# Patient Record
Sex: Male | Born: 1945 | Race: White | Hispanic: No | Marital: Married | Smoking: Never smoker
Health system: Southern US, Community
[De-identification: ages and names within clinical notes are randomized; demographics above are authoritative.]

## PROBLEM LIST (undated history)

## (undated) DIAGNOSIS — I10 Essential (primary) hypertension: Secondary | ICD-10-CM

## (undated) DIAGNOSIS — E785 Hyperlipidemia, unspecified: Secondary | ICD-10-CM

## (undated) DIAGNOSIS — E039 Hypothyroidism, unspecified: Secondary | ICD-10-CM

## (undated) HISTORY — DX: Essential (primary) hypertension: I10

## (undated) HISTORY — DX: Hypothyroidism, unspecified: E03.9

## (undated) HISTORY — DX: Hyperlipidemia, unspecified: E78.5

---
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
## 1984-12-09 HISTORY — PX: COLON RESECTION: SHX5231

## 1985-12-09 HISTORY — PX: OTHER SURGICAL HISTORY: SHX169

## 1994-12-09 HISTORY — PX: SEPTOPLASTY: SUR1290

## 1994-12-09 HISTORY — PX: OTHER SURGICAL HISTORY: SHX169

## 1994-12-09 HISTORY — PX: TONSILLECTOMY: SUR1361

## 2000-03-28 ENCOUNTER — Ambulatory Visit (HOSPITAL_COMMUNITY): Admission: RE | Admit: 2000-03-28 | Discharge: 2000-03-28 | Payer: Self-pay | Admitting: General Surgery

## 2000-03-28 ENCOUNTER — Encounter (INDEPENDENT_AMBULATORY_CARE_PROVIDER_SITE_OTHER): Payer: Self-pay | Admitting: Specialist

## 2000-03-28 ENCOUNTER — Encounter: Payer: Self-pay | Admitting: General Surgery

## 2002-12-09 HISTORY — PX: LAPAROSCOPIC CHOLECYSTECTOMY: SUR755

## 2003-05-04 ENCOUNTER — Ambulatory Visit (HOSPITAL_COMMUNITY): Admission: RE | Admit: 2003-05-04 | Discharge: 2003-05-04 | Payer: Self-pay | Admitting: Family Medicine

## 2004-03-06 ENCOUNTER — Emergency Department (HOSPITAL_COMMUNITY): Admission: EM | Admit: 2004-03-06 | Discharge: 2004-03-06 | Payer: Self-pay

## 2004-03-30 ENCOUNTER — Ambulatory Visit (HOSPITAL_COMMUNITY): Admission: RE | Admit: 2004-03-30 | Discharge: 2004-03-30 | Payer: Self-pay | Admitting: General Surgery

## 2004-04-19 ENCOUNTER — Observation Stay (HOSPITAL_COMMUNITY): Admission: RE | Admit: 2004-04-19 | Discharge: 2004-04-20 | Payer: Self-pay | Admitting: General Surgery

## 2004-04-19 ENCOUNTER — Encounter (INDEPENDENT_AMBULATORY_CARE_PROVIDER_SITE_OTHER): Payer: Self-pay | Admitting: *Deleted

## 2004-05-28 ENCOUNTER — Encounter: Admission: RE | Admit: 2004-05-28 | Discharge: 2004-05-28 | Payer: Self-pay | Admitting: Orthopedic Surgery

## 2004-08-06 IMAGING — CT CT ABDOMEN W/ CM
1 of 4 series · 14 of 32 positions shown, 19 images · IV contrast (omnipaque)
Comparison: No prior studies available for comparison.

CLINICAL DATA: Abdominal pain.
CT ABDOMEN WITH CONTRAST 03/06/04
TECHNIQUE: Contiguous axial CT images were obtained from the lung bases to the iliac crests following oral contrast and intravenous administration of 150 cc of Omnipaque 300 IV contrast.
TECHNIQUE: Contiguous axial CT images were obtained from the iliac crests to the proximal femurs.

[Series 2: abd/pelvis 5.0 b30f · axial · 0.82mm/px · z∈[+1323,+1773]mm · 14 of 104 slices shown, 19 images]
[im 7/104  soft-tissue]
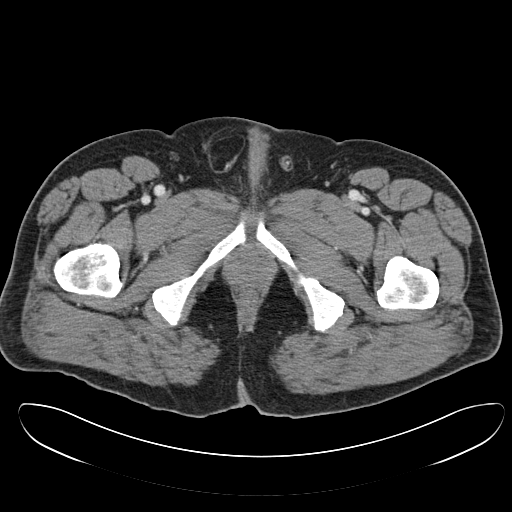
[im 7/104  bone]
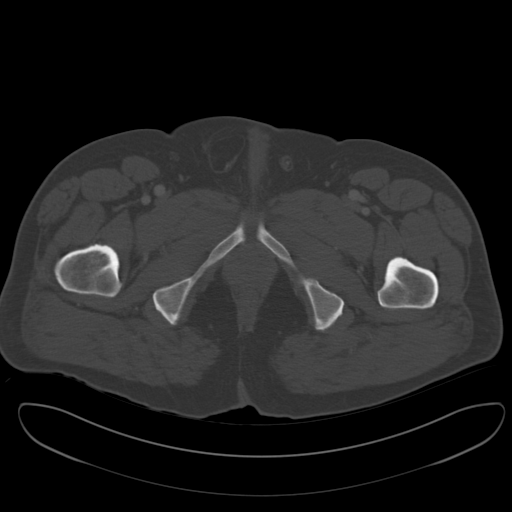
[im 13/104  soft-tissue]
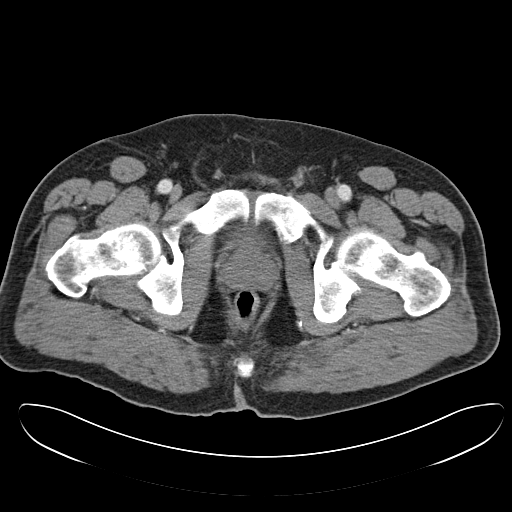
[im 25/104  soft-tissue]
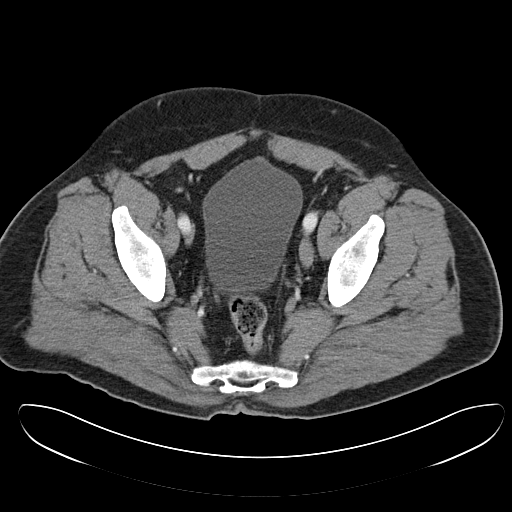
[im 31/104  soft-tissue]
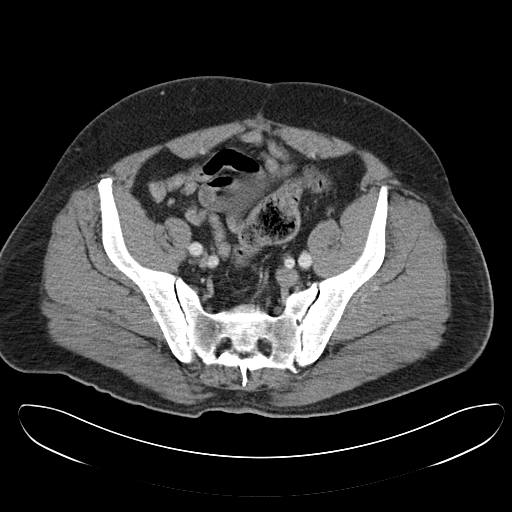
[im 37/104  soft-tissue]
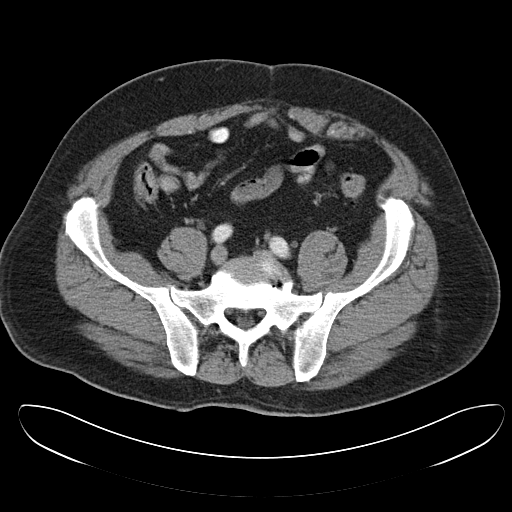
[im 43/104  soft-tissue]
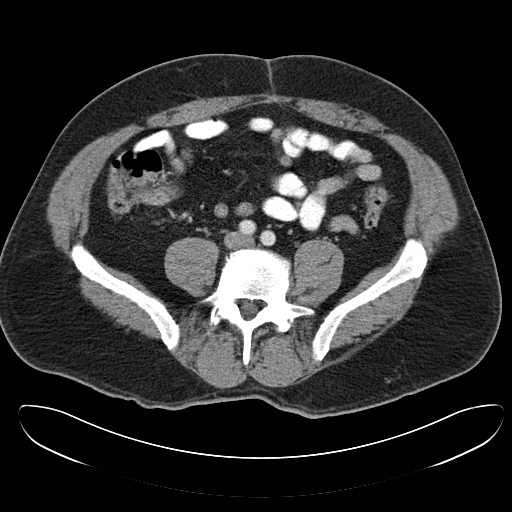
[im 55/104  soft-tissue]
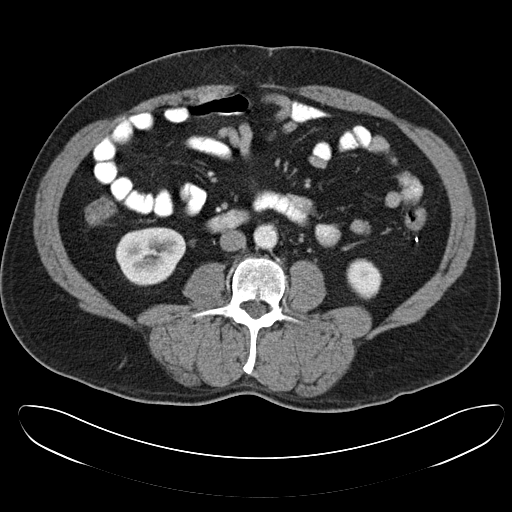
[im 61/104  soft-tissue]
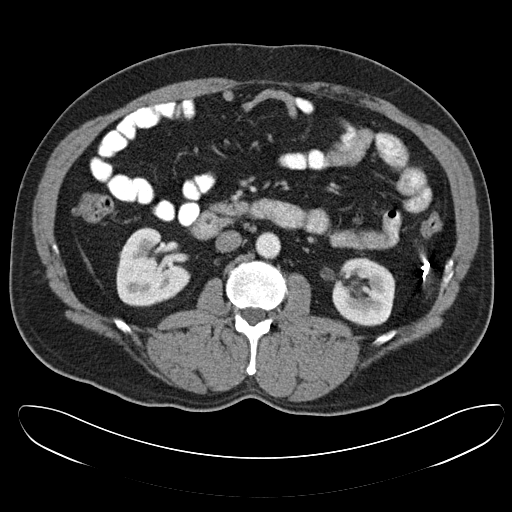
[im 67/104  soft-tissue]
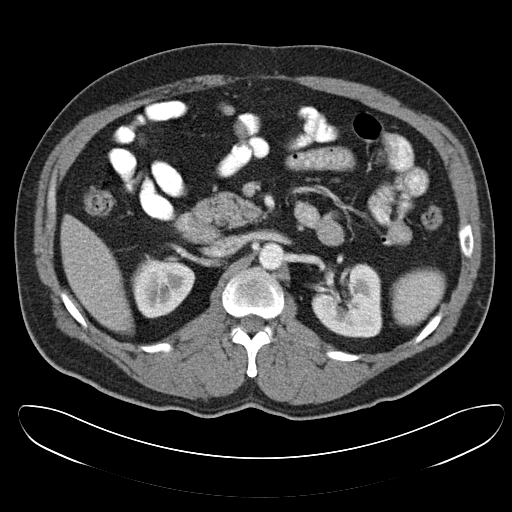
[im 67/104  bone]
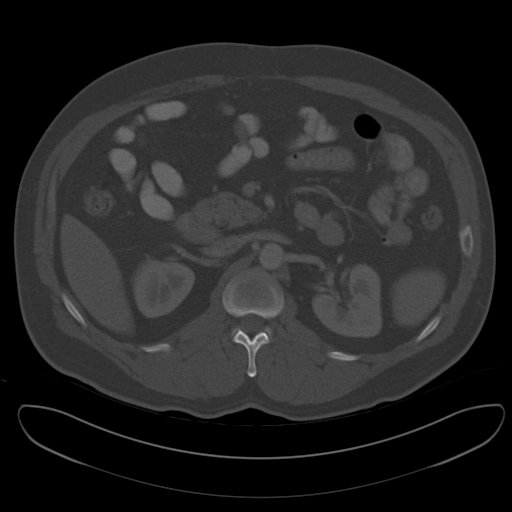
[im 73/104  soft-tissue]
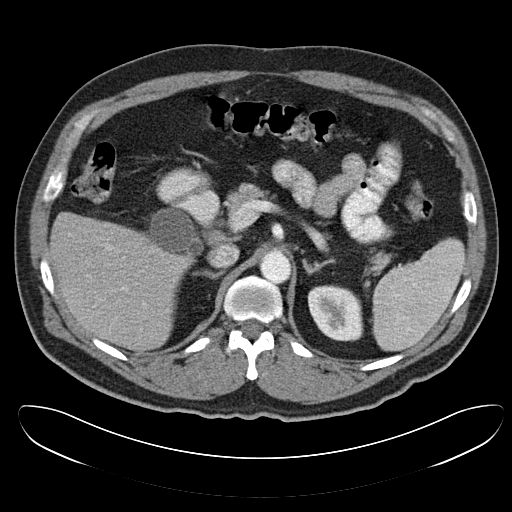
[im 79/104  soft-tissue]
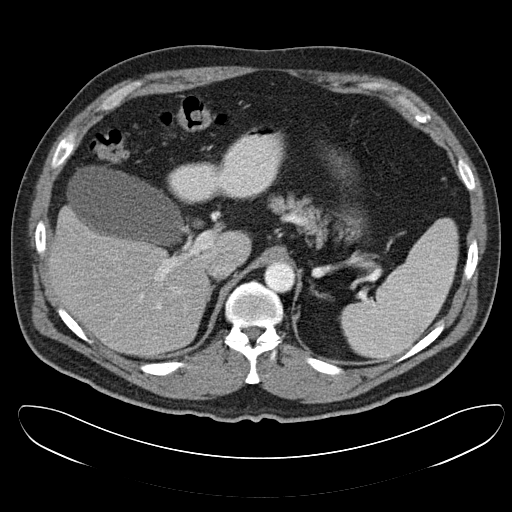
[im 79/104  lung]
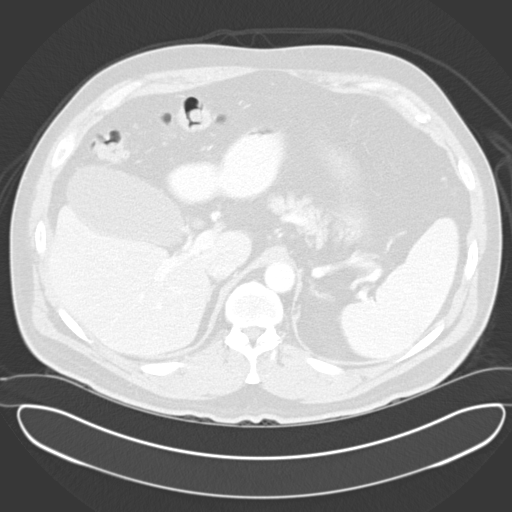
[im 85/104  lung]
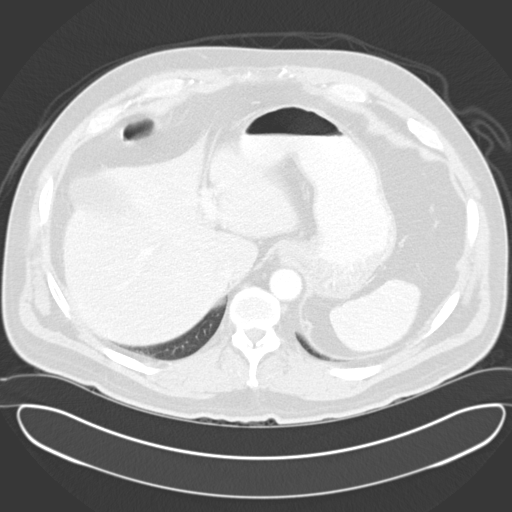
[im 91/104  soft-tissue]
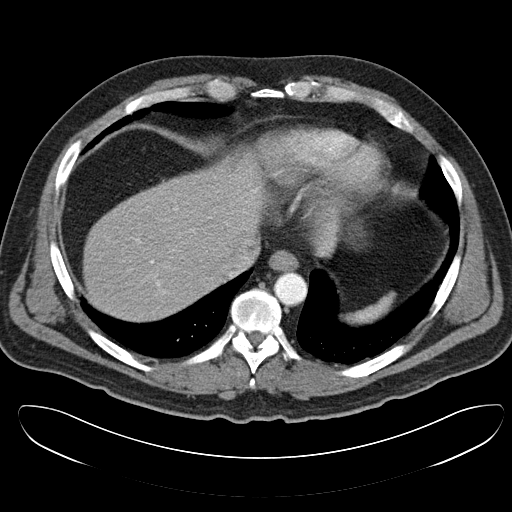
[im 91/104  lung]
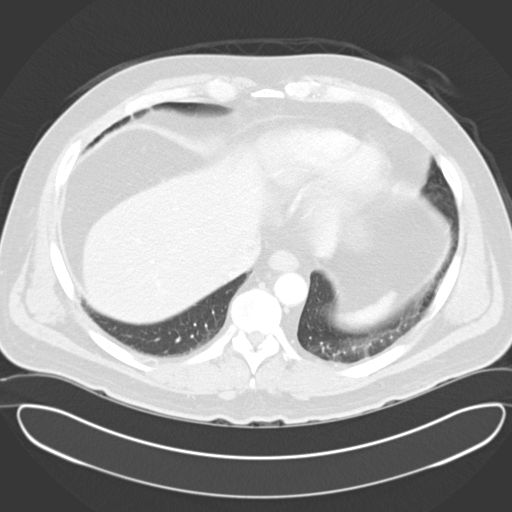
[im 97/104  soft-tissue]
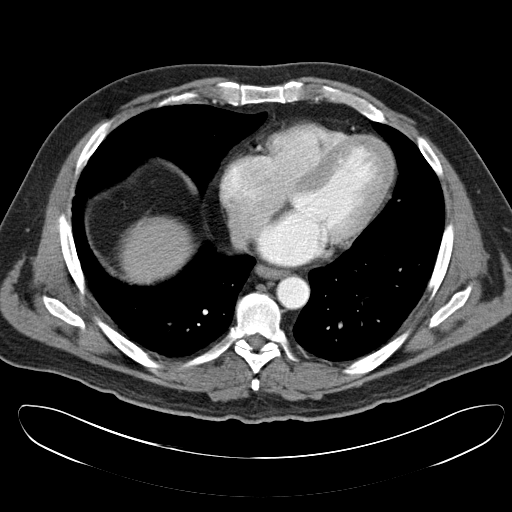
[im 97/104  lung]
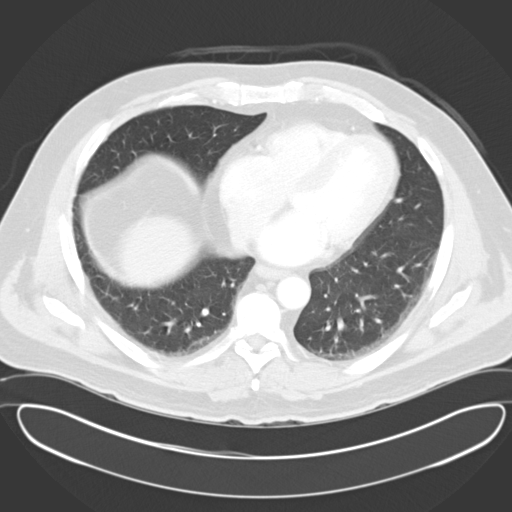

[14 of 32 positions shown; findings below may reference images not displayed]

FINDINGS: There are multiple calcified gallstones in the gallbladder.  The gallbladder is mildly distended measuring up to 12 cm in length.  The common bile duct measures 8 mm in diameter which is abnormal.  No pancreatic duct dilatation is evident.  The spleen appears unremarkable.  No intrahepatic bile duct dilatation is noted.  Adrenal glands appear normal.  
There is a 6 mm hypodense lesion in the right mid kidney most consistent with a small cyst.  The left kidney appears unremarkable. 
Scattered small retroperitoneal lymph nodes are present, but are not pathologically enlarged by CT size criteria.  
IMPRESSION
1.  Gallstones and mildly dilated gallbladder.  The common bile duct appears minimally dilated.  
2.  Hypodense lesion in the right kidney is likely to represent a cyst. 
CT OF THE PELVIS WITH CONTRAST
FINDINGS: There is no visualization of the appendix.  No free pelvic fluid.  There are some colonic diverticula, but no evidence of active diverticulitis.  Distal ureters appear unremarkable.  A right inguinal hernia contains only adipose tissue. 
IMPRESSION
1.  Right inguinal hernia containing adipose tissue.
2.  Mild descending colon diverticulosis without active diverticulitis.

## 2004-10-22 ENCOUNTER — Ambulatory Visit: Payer: Self-pay | Admitting: Gastroenterology

## 2006-05-16 ENCOUNTER — Ambulatory Visit: Payer: Self-pay | Admitting: Gastroenterology

## 2006-05-29 ENCOUNTER — Ambulatory Visit: Payer: Self-pay | Admitting: Gastroenterology

## 2008-04-29 ENCOUNTER — Ambulatory Visit (HOSPITAL_COMMUNITY): Admission: RE | Admit: 2008-04-29 | Discharge: 2008-04-29 | Payer: Self-pay | Admitting: *Deleted

## 2008-05-07 ENCOUNTER — Emergency Department (HOSPITAL_COMMUNITY): Admission: EM | Admit: 2008-05-07 | Discharge: 2008-05-07 | Payer: Self-pay | Admitting: Emergency Medicine

## 2008-09-29 IMAGING — CR DG CHEST 2V
2 series · 2 of 2 positions shown · non-contrast
Comparison: 04/18/2004

CLINICAL DATA: Preop

CHEST - 2 VIEW

[w chest pa]
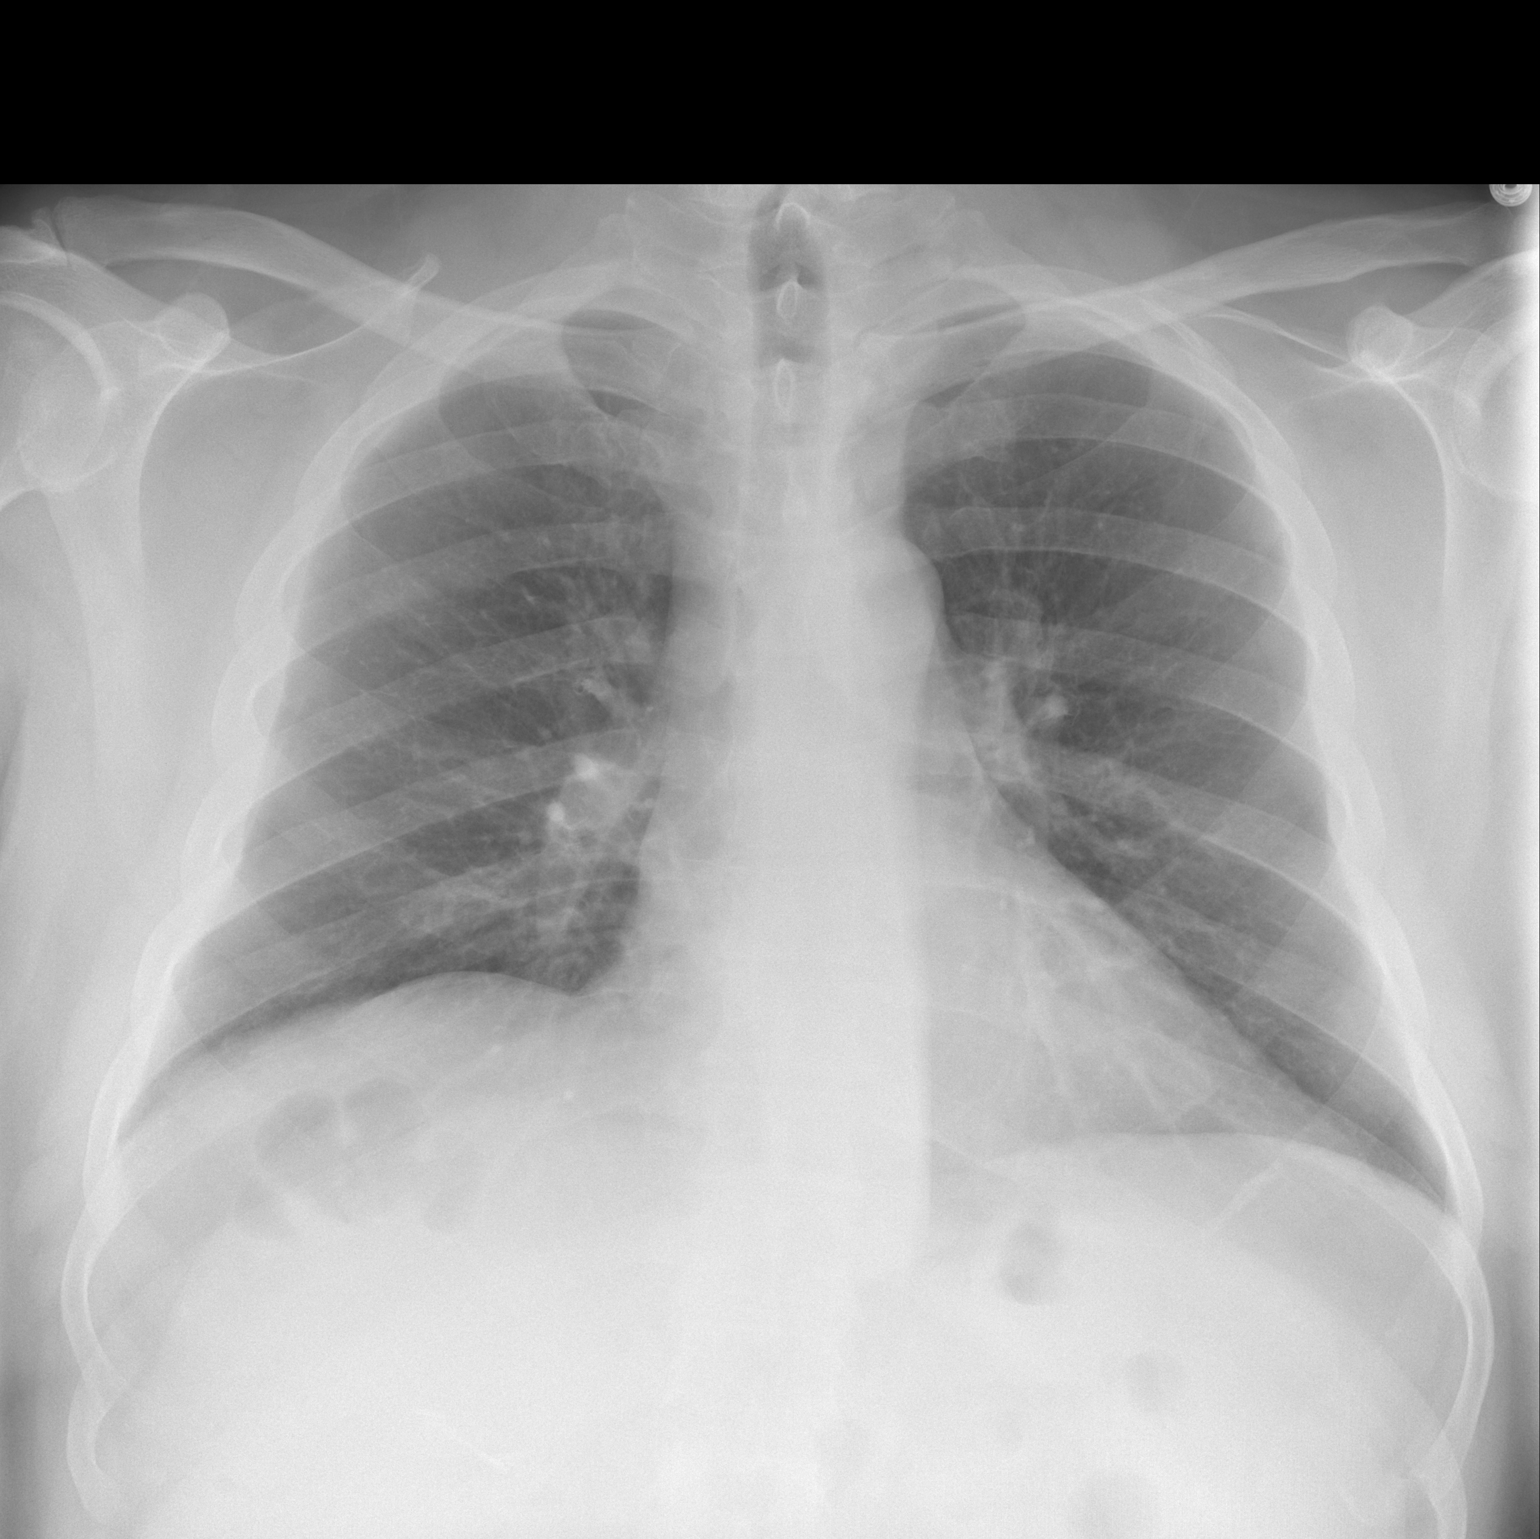

[w chest lat]
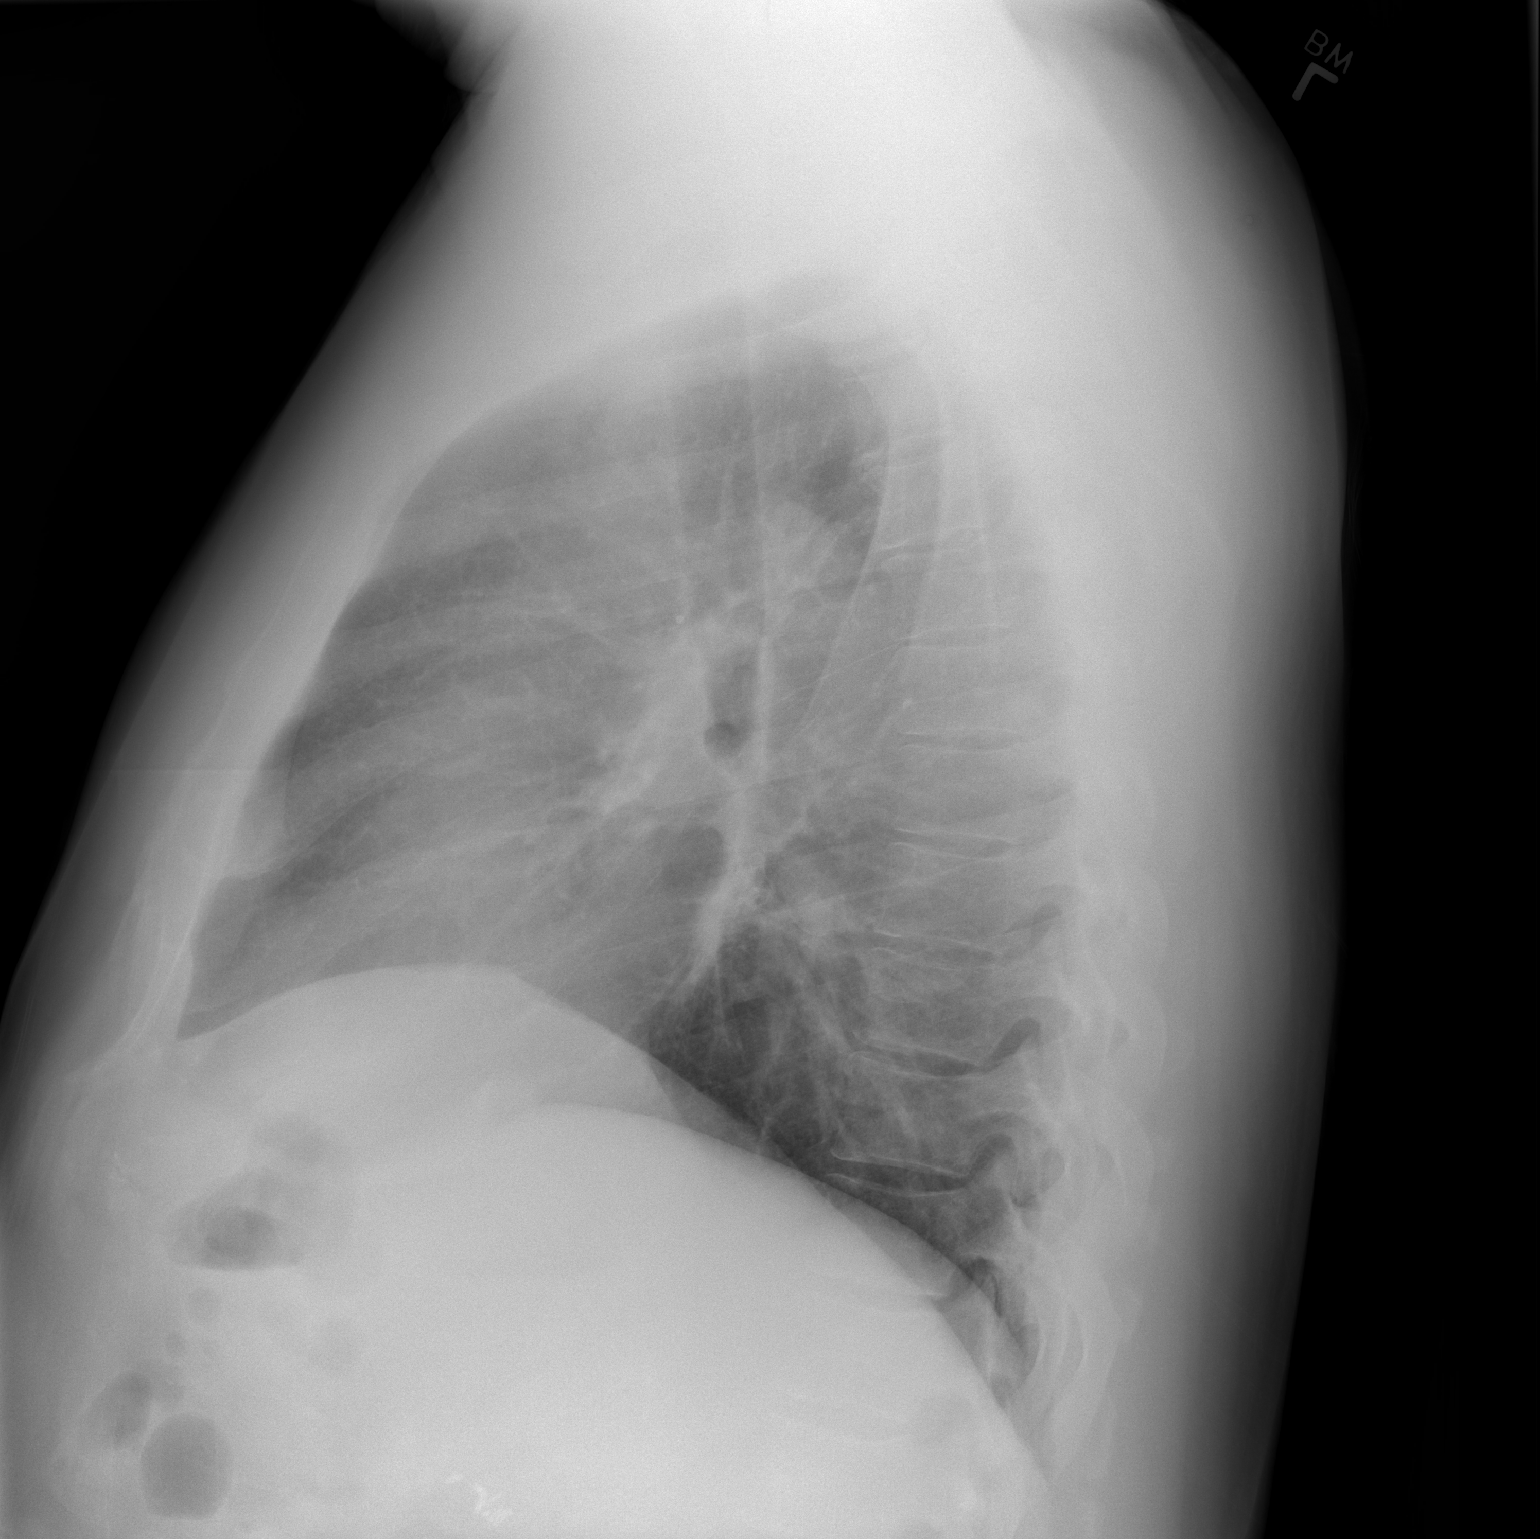

[2 of 2 positions shown; findings below may reference images not displayed]

FINDINGS: The cardiomediastinal silhouette is stable.  No acute
infiltrate or edema.  The bony thorax is unremarkable.  No pleural
effusion.
IMPRESSION: No active cardiopulmonary disease.

## 2009-07-11 ENCOUNTER — Telehealth: Payer: Self-pay | Admitting: Internal Medicine

## 2009-07-12 ENCOUNTER — Ambulatory Visit: Payer: Self-pay | Admitting: Gastroenterology

## 2009-07-12 DIAGNOSIS — K5732 Diverticulitis of large intestine without perforation or abscess without bleeding: Secondary | ICD-10-CM

## 2009-07-12 DIAGNOSIS — R1032 Left lower quadrant pain: Secondary | ICD-10-CM

## 2009-07-13 LAB — CONVERTED CEMR LAB
Basophils Absolute: 0.1 10*3/uL (ref 0.0–0.1)
Basophils Relative: 1 % (ref 0.0–3.0)
Bilirubin Urine: NEGATIVE
Eosinophils Absolute: 1.1 10*3/uL — ABNORMAL HIGH (ref 0.0–0.7)
HCT: 44.8 % (ref 39.0–52.0)
Hemoglobin, Urine: NEGATIVE
Leukocytes, UA: NEGATIVE
Lymphocytes Relative: 25.3 % (ref 12.0–46.0)
Lymphs Abs: 2.5 10*3/uL (ref 0.7–4.0)
MCHC: 34.2 g/dL (ref 30.0–36.0)
Monocytes Absolute: 1 10*3/uL (ref 0.1–1.0)
Monocytes Relative: 10.3 % (ref 3.0–12.0)
Neutro Abs: 5.1 10*3/uL (ref 1.4–7.7)
Neutrophils Relative %: 52.4 % (ref 43.0–77.0)
RBC: 5.25 M/uL (ref 4.22–5.81)
RDW: 13.8 % (ref 11.5–14.6)
Specific Gravity, Urine: 1.02 (ref 1.000–1.030)
Total Protein, Urine: NEGATIVE mg/dL
Urine Glucose: NEGATIVE mg/dL
pH: 7 (ref 5.0–8.0)

## 2011-04-23 NOTE — Op Note (Signed)
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   PROCEDURE:  Anoscopy with left lateral internal sphincterotomy.   ANESTHESIA:  General.   SURGEON:  Alfonse Ras, MD.   DESCRIPTION:  The patient was taken to the operating room, placed in  supine position.  After adequate general anesthesia was induced using  laryngeal mask, the patient was placed lithotomy position and perianal  and rectal prep were undertaken.  Anal dilatation was accomplished  gently to 3 fingerbreadths.  The internal and external sphincter muscles  were injected with 0.5 Marcaine.  The mucosa and submucosa overlying the  left lateral area of the anus were anesthetized and injected with  Marcaine.  A longitudinal incision was made over the internal sphincter  muscle and it was elevated through the wound with a hemostat.  It was  divided completely, with electrocautery and adequate hemostasis was  ensured.  Gelfoam packing was placed.  Dressing was applied.  The  patient tolerated the procedure well went to PACU in good condition.      Alfonse Ras, MD  Electronically Signed     KRE/MEDQ  D:  04/29/2008  T:  04/29/2008  Job:  [id number]

## 2011-04-26 NOTE — Op Note (Signed)
NAME:  Ian Lane, Ian Lane                          ACCOUNT NO.:  [id number]   MEDICAL RECORD NO.:  [id number]                   PATIENT TYPE:  AMB   LOCATION:  DAY                                  [facility]ACILITY[facility]   PHYSICIAN:  Timothy E. Earlene Plater, M.D.              DATE OF BIRTH:  04/28/1946   DATE OF PROCEDURE:  04/19/2004  DATE OF DISCHARGE:                                 OPERATIVE REPORT   PREOPERATIVE DIAGNOSIS:  Chronic cholecystolithiasis.   POSTOPERATIVE DIAGNOSIS:  Chronic cholecystolithiasis.   PROCEDURE:  Laparoscopic cholecystectomy.   SURGEON:  Timothy E. Earlene Plater, M.D.   ASSISTANT:  Anselm Pancoast. Zachery Dakins, M.D.   ANESTHESIA:  CRNA supervised, Dr. Almeta Monas.   Ian Lane has symptomatic gallstones and wishes to proceed with laparoscopic  cholecystectomy.  His chest x-ray is normal.  Cardiogram is normal with  bradycardia.  Chemistry profile is normal with slight elevation of  bilirubin.  CBC is normal.  Patient has had two prior abdominal surgeries  and knows the risks of an open procedure.  He was identified and the permit  signed.   Patient was taken to the operating room and placed supine.  General  endotracheal anesthesia administered.  PAS hose and abdominal clip was  accomplished.  The abdomen was prepped and draped in the usual fashion.  Marcaine 0.25% with epinephrine was used prior to all incisions.  I did make  an infraumbilical incision through the old midline incision.  The fascia was  identified.  We carefully sharply dissected through the fascia and then with  blunt dissection was able to make a tunnel into the right upper quadrant.  The Hasson catheter was placed, tied and placed with a #1 Vicryl, and the  abdomen insufflated.  A 5 mm trocar was introduced into the right lower  quadrant under direct vision.  Then a 10 mm trocar in the mid epigastrium  introduced under direct vision and a second 5 mm right lower quadrant under  direct vision.  The large amount of  omental fat was bluntly pulled down into  the mid abdomen, and the gallbladder was visible.  It was grasped and placed  under tension.  The fatty adhesions to the omentum were taken down bluntly,  and the gallbladder was viewed in its entirety.  More fatty tissue was  dissected off of the base of the gallbladder, and a normal-appearing cystic  duct was identified and clearly dissected.  A complete window was made  posteriorly and no other structures were seen.  The clip was placed near the  gallbladder on the cystic duct.  The cystic duct was opened.  Clear bile  exuded.  A catheter was passed percutaneously and introduced into the cystic  duct and clipped applied.  Real-time fluoroscopy was used for cholangiogram,  and it was carried out with free, smooth flow of dye into the entire biliary  system, which appeared normal,  and into the duodenum, which was normal.  The  clipped catheter removed.  The stump of the cystic duct triply clipped and  divided.  Three branches of the cystic artery identified, isolated, and  clipped.  The gallbladder was then removed from the gallbladder bed without  incident or complication.  Cautery and irrigation were used to control  oozing in the gallbladder bed.  At the completion, it was completely dry.  The gallbladder was placed in an EndoCatch bag, and then it was removed  through the infraumbilical incision.  An additional #1 PDS suture was placed  there under direct vision, and that wound was closed.  Copious irrigation  was carried out until clear.  One single adhesion to a loop of small bowel,  stringy type of adhesion to the anterior abdominal wall was cut.  The other  adhesions were left alone.  No injuries or complications were noted.  Sponge  counts were correct.  All irrigant, CO2, instruments, and trocars removed  under  direct vision.  The skin incisions were all checked and then closed with 4-0  Monocryl.  Steri-Strips applied as well as dry  sterile dressing.  Final  counts were correct and tolerated well.  Was awakened and taken to the  recovery room in good condition.                                               Timothy E. Earlene Plater, M.D.    TED/MEDQ  D:  04/19/2004  T:  04/19/2004  Job:  [id number]

## 2011-08-02 ENCOUNTER — Encounter: Payer: Self-pay | Admitting: Internal Medicine

## 2011-09-04 LAB — BASIC METABOLIC PANEL
BUN: 20
Calcium: 9.2
GFR calc Af Amer: >60
GFR calc non Af Amer: >60
Potassium: 4.5

## 2012-08-12 ENCOUNTER — Encounter: Payer: Self-pay | Admitting: Internal Medicine

## 2012-08-19 ENCOUNTER — Encounter: Payer: Self-pay | Admitting: Internal Medicine

## 2012-08-31 ENCOUNTER — Encounter: Payer: Self-pay | Admitting: Internal Medicine

## 2012-08-31 ENCOUNTER — Ambulatory Visit (AMBULATORY_SURGERY_CENTER): Payer: Managed Care, Other (non HMO) | Admitting: *Deleted

## 2012-08-31 VITALS — Ht 71.0 in | Wt 244.7 lb

## 2012-08-31 DIAGNOSIS — K5730 Diverticulosis of large intestine without perforation or abscess without bleeding: Secondary | ICD-10-CM

## 2012-08-31 DIAGNOSIS — Z1211 Encounter for screening for malignant neoplasm of colon: Secondary | ICD-10-CM

## 2012-08-31 MED ORDER — NA SULFATE-K SULFATE-MG SULF 17.5-3.13-1.6 GM/177ML PO SOLN: ORAL | Status: AC

## 2012-09-18 ENCOUNTER — Encounter: Payer: Self-pay | Admitting: Internal Medicine

## 2012-09-29 ENCOUNTER — Encounter: Payer: Self-pay | Admitting: Internal Medicine

## 2023-04-04 ENCOUNTER — Ambulatory Visit: Payer: 59 | Admitting: Podiatry

## 2023-04-08 ENCOUNTER — Ambulatory Visit (INDEPENDENT_AMBULATORY_CARE_PROVIDER_SITE_OTHER): Payer: 59 | Admitting: Podiatry

## 2023-04-08 DIAGNOSIS — L905 Scar conditions and fibrosis of skin: Secondary | ICD-10-CM | POA: Diagnosis not present

## 2023-04-08 DIAGNOSIS — M778 Other enthesopathies, not elsewhere classified: Secondary | ICD-10-CM | POA: Diagnosis not present

## 2023-04-08 DIAGNOSIS — M205X2 Other deformities of toe(s) (acquired), left foot: Secondary | ICD-10-CM | POA: Diagnosis not present

## 2023-04-08 NOTE — Progress Notes (Signed)
  Subjective:  Patient ID: Ian Lane, male    DOB: Sep 27, 1946,  MRN: [id number]  Chief Complaint  Patient presents with   Foot Problem    Patient complaining of "something is in the skin" when he bends his toes inwards. Patient feels like he cannot bend his toes inwards without feeling like something is preventing it. Denies any pain or swelling.     77 y.o. male presents with concern for possibly feeling as if something is in his skin at the bottom of his foot when he bends his toes especially on the left foot more so than the right.  He denies stepping on anything or any wounds recently.  He does not describe pain with this more so an abnormal sensation when he bends the toes feels like something is preventing the toes from bending appropriately and like there is something in the ball of his foot that is irritating him.  Past Medical History:  Diagnosis Date   Hyperlipidemia    Hypertension    Hypothyroidism     No Known Allergies  ROS: Negative except as per HPI above  Objective:  General: AAO x3, NAD  Dermatological: With inspection and palpation of the right and left lower extremities there are no open sores, no preulcerative lesions, no rash or signs of infection present. Nails are of normal length thickness and coloration.   Vascular:  Dorsalis Pedis artery and Posterior Tibial artery pedal pulses are 2/4 bilateral.  Capillary fill time < 3 sec to all digits.   Neruologic: Grossly intact via light touch bilateral. Protective threshold intact to all sites bilateral.   Musculoskeletal: Scar tissue contracture noted of the MPJs of the second third MPJ bilaterally.  No pain on palpation of these areas.  There is decreased flexion range of motion available at the MPJ's due to the scar tissue contracture.  Gait: Unassisted, Nonantalgic.   No images are attached to the encounter.  Radiographs:  Deferred Assessment:   1. Contracture of toe of left foot   2. Scar tissue    3. Capsulitis of foot      Plan:  Patient was evaluated and treated and all questions answered.  # Possible inflammation and capsulitis of the second and third MPJ bilateral foot related to scar tissue contracture -Discussed the patient does not have any evidence of fracture or foreign body based on exam -Suspect this is more so related to scar tissue contracture at the MPJ is causing some inflammation there and decreased range of motion -Recommend steroid injection in between the second and third MPJs on the bilateral foot for reduction in pain and this abnormal sensation of something pulling -Proceeded with injection of 1 cc half percent Marcaine plain with 1 cc Kenalog 10 in between the second and third MPJ bilateral foot   Return in about 6 weeks (around 05/20/2023) for f/u bilateral forefoot injections.          Corinna Gab, DPM [facility]

## 2023-05-20 ENCOUNTER — Ambulatory Visit: Payer: 59 | Admitting: Podiatry

## 2023-06-09 ENCOUNTER — Ambulatory Visit (INDEPENDENT_AMBULATORY_CARE_PROVIDER_SITE_OTHER): Payer: 59 | Admitting: Podiatry

## 2023-06-09 DIAGNOSIS — Z91199 Patient's noncompliance with other medical treatment and regimen due to unspecified reason: Secondary | ICD-10-CM

## 2023-06-09 NOTE — Progress Notes (Signed)
Pt was a no show for apt, CG 

## 2023-06-30 ENCOUNTER — Ambulatory Visit: Payer: Managed Care, Other (non HMO) | Admitting: Podiatry
